# Patient Record
Sex: Female | Born: 1965 | Race: Black or African American | Hispanic: No | Smoking: Never smoker
Health system: Southern US, Community
[De-identification: ages and names within clinical notes are randomized; demographics above are authoritative.]

---

## 2013-09-11 ENCOUNTER — Other Ambulatory Visit (HOSPITAL_COMMUNITY): Payer: Self-pay | Admitting: Plastic Surgery

## 2013-09-11 DIAGNOSIS — IMO0002 Reserved for concepts with insufficient information to code with codable children: Secondary | ICD-10-CM

## 2013-09-12 ENCOUNTER — Other Ambulatory Visit: Payer: Self-pay | Admitting: Radiology

## 2013-09-12 ENCOUNTER — Other Ambulatory Visit (HOSPITAL_COMMUNITY): Payer: Self-pay | Admitting: Plastic Surgery

## 2013-09-12 ENCOUNTER — Ambulatory Visit (HOSPITAL_COMMUNITY)
Admission: RE | Admit: 2013-09-12 | Discharge: 2013-09-12 | Disposition: A | Discharge status: Home / Self Care | Payer: Self-pay | Source: Ambulatory Visit | Attending: Plastic Surgery | Admitting: Plastic Surgery

## 2013-09-12 DIAGNOSIS — R188 Other ascites: Secondary | ICD-10-CM | POA: Insufficient documentation

## 2013-09-12 DIAGNOSIS — IMO0002 Reserved for concepts with insufficient information to code with codable children: Secondary | ICD-10-CM

## 2013-09-12 MED ORDER — CEFAZOLIN SODIUM-DEXTROSE 2-3 GM-% IV SOLR
2.0000 g | Freq: Once | INTRAVENOUS | Status: DC
  Filled 2013-09-12: qty 50

## 2013-09-13 ENCOUNTER — Encounter (HOSPITAL_COMMUNITY): Payer: Self-pay

## 2013-09-13 ENCOUNTER — Ambulatory Visit (HOSPITAL_COMMUNITY): Admission: RE | Admit: 2013-09-13 | Payer: Self-pay | Source: Ambulatory Visit

## 2013-09-13 ENCOUNTER — Ambulatory Visit (HOSPITAL_COMMUNITY)
Admission: RE | Admit: 2013-09-13 | Discharge: 2013-09-13 | Disposition: A | Discharge status: Home / Self Care | Payer: Self-pay | Source: Ambulatory Visit | Attending: Plastic Surgery | Admitting: Plastic Surgery

## 2013-09-13 DIAGNOSIS — IMO0002 Reserved for concepts with insufficient information to code with codable children: Secondary | ICD-10-CM | POA: Insufficient documentation

## 2013-09-13 DIAGNOSIS — G8918 Other acute postprocedural pain: Secondary | ICD-10-CM | POA: Insufficient documentation

## 2013-09-13 DIAGNOSIS — Y849 Medical procedure, unspecified as the cause of abnormal reaction of the patient, or of later complication, without mention of misadventure at the time of the procedure: Secondary | ICD-10-CM | POA: Insufficient documentation

## 2013-09-13 LAB — PROTIME-INR
INR: 1.09 (ref 0.00–1.49)
Prothrombin Time: 13.9 seconds (ref 11.6–15.2)

## 2013-09-13 LAB — CBC
HEMATOCRIT: 25.8 % — AB (ref 36.0–46.0)
Hemoglobin: 8.5 g/dL — ABNORMAL LOW (ref 12.0–15.0)
MCH: 28.7 pg (ref 26.0–34.0)
MCHC: 32.9 g/dL (ref 30.0–36.0)
MCV: 87.2 fL (ref 78.0–100.0)
Platelets: 340 10*3/uL (ref 150–400)
RBC: 2.96 MIL/uL — AB (ref 3.87–5.11)
RDW: 14.1 % (ref 11.5–15.5)
WBC: 5.6 10*3/uL (ref 4.0–10.5)

## 2013-09-13 LAB — APTT: APTT: 32 s (ref 24–37)

## 2013-09-13 MED ORDER — FENTANYL CITRATE 0.05 MG/ML IJ SOLN
INTRAMUSCULAR | Status: AC
  Filled 2013-09-13: qty 2

## 2013-09-13 MED ORDER — SODIUM CHLORIDE 0.9 % IV SOLN: Freq: Once | INTRAVENOUS | Status: DC

## 2013-09-13 MED ORDER — MIDAZOLAM HCL 2 MG/2ML IJ SOLN
INTRAMUSCULAR | Status: AC
  Filled 2013-09-13: qty 4

## 2013-09-13 MED ORDER — MIDAZOLAM HCL 2 MG/2ML IJ SOLN
INTRAMUSCULAR | Status: AC | PRN
  Administered 2013-09-13: 1 mg via INTRAVENOUS
  Administered 2013-09-13: 2 mg via INTRAVENOUS
  Administered 2013-09-13: 1 mg via INTRAVENOUS

## 2013-09-13 MED ORDER — FENTANYL CITRATE 0.05 MG/ML IJ SOLN
INTRAMUSCULAR | Status: AC | PRN
  Administered 2013-09-13 (×2): 50 ug via INTRAVENOUS

## 2013-09-13 NOTE — H&P (Signed)
Nicole Carey is an 48 y.o. female.   Chief Complaint: Pt has undergone abdominal liposuction 08/07/2013 Has had aspiration of abd fluid aspirated every 3 days by surgeon in office since surgery Has gradually been less and less accumulation Fluid serous/blood tinged color per pt. Most recent aspiration was 6/2. MD only able to draw off 10 cc but pt felt "bloated" Abd [place] 6/3 reveals seroma 18 cm x 3 cm in size in low abd Now scheduled for aspiration/possible drain placement with US guidance  HPI: none contributory  History reviewed. No pertinent past medical history.  History reviewed. No pertinent past surgical history.  History reviewed. No pertinent family history. Social History:  reports that she has never smoked. She does not have any smokeless tobacco history on file. Her alcohol and drug histories are not on file.  Allergies: No Known Allergies   (Not in a hospital admission)  Results for orders placed during the hospital encounter of 09/13/13 (from the past 48 hour(s))  APTT     Status: None   Collection Time    09/13/13 12:22 PM      Result Value Ref Range   aPTT 32  24 - 37 seconds  CBC     Status: Abnormal   Collection Time    09/13/13 12:22 PM      Result Value Ref Range   WBC 5.6  4.0 - 10.5 K/uL   RBC 2.96 (*) 3.87 - 5.11 MIL/uL   Hemoglobin 8.5 (*) 12.0 - 15.0 g/dL   HCT 76.5 (*) 46.5 - 03.5 %   MCV 87.2  78.0 - 100.0 fL   MCH 28.7  26.0 - 34.0 pg   MCHC 32.9  30.0 - 36.0 g/dL   RDW 46.5  68.1 - 27.5 %   Platelets 340  150 - 400 K/uL  PROTIME-INR     Status: None   Collection Time    09/13/13 12:22 PM      Result Value Ref Range   Prothrombin Time 13.9  11.6 - 15.2 seconds   INR 1.09  0.00 - 1.49   US Abdomen Limited  09/12/2013   CLINICAL DATA:  Abdominal fluid collection. Status post liposuction. The fluid collection was aspirated in the office recently.  EXAM: LIMITED ABDOMINAL ULTRASOUND  COMPARISON:  None.  FINDINGS: There is a 17.8 x 2.9 cm  subcutaneous fluid collection in the surgical bed. It is complex with internal echogenic signal.  IMPRESSION: Large subcutaneous fluid collection. Patient will return tomorrow for drain.   Electronically Signed   By: Maryclare Bean M.D.   On: 09/12/2013 12:54    Review of Systems  Constitutional: Negative for fever and weight loss.  Respiratory: Negative for cough and shortness of breath.   Cardiovascular: Negative for chest pain.  Gastrointestinal: Negative for nausea, vomiting and abdominal pain.  Neurological: Negative for weakness and headaches.  Psychiatric/Behavioral: Negative for substance abuse.    Blood pressure 121/64, pulse 83, temperature 99.1 F (37.3 C), temperature source Oral, resp. rate 18, height 5\' 6"  (1.676 m), weight 81.647 kg (180 lb), last menstrual period 09/05/2013, SpO2 100.00%. Physical Exam  Constitutional: She is oriented to person, place, and time. She appears well-nourished.  Cardiovascular: Normal rate, regular rhythm and normal heart sounds.   No murmur heard. Respiratory: Effort normal and breath sounds normal. She has no wheezes.  GI: Soft. Bowel sounds are normal. There is no tenderness.  abd soft; NT No masses  Musculoskeletal: Normal range of motion.  Neurological:  She is alert and oriented to person, place, and time.  Skin: Skin is warm and dry.  Psychiatric: She has a normal mood and affect. Her behavior is normal. Judgment and thought content normal.     Assessment/Plan Previous abd liposuction 08/07/13 Accumulation of fluid in abd aspirated by MD in office every 3 days since surgery. Pt continues to have bloated feeling US abd 6/3 reveals 18cm x 3 cm seroma in low abd Scheduled now for asp vs drain placement. Pt aware of procedure benefits and risks and agreeable to proceed Consent signed and in chart  Robet Leuamela A Jamarl Pew 09/13/2013, 1:43 PM

## 2013-09-13 NOTE — Sedation Documentation (Signed)
Discharge instructions given pt verbalizes understanding

## 2013-09-13 NOTE — Procedures (Signed)
Interventional Radiology Procedure Note  Procedure: Placement of a 69F drain modified with additional sideholes into the superficial abdominal wall complex fluid.  150 mL dark, old blood aspirated. Drain left to JP drain.  Complications: None Recommendations: - Maintain drain to JP bulb.  May cap for short periods of activity as needed. - Pt to follow up with plastics next week.  If drain output < 10-15 mL for at least 2 days, may remove drain.   Signed,  Sterling Big, MD [facility]

## 2014-09-06 ENCOUNTER — Other Ambulatory Visit: Payer: Self-pay | Admitting: Obstetrics & Gynecology

## 2014-09-06 DIAGNOSIS — N63 Unspecified lump in unspecified breast: Secondary | ICD-10-CM

## 2015-02-14 IMAGING — US US ABDOMEN LIMITED
1 series · 14 of 16 positions shown · non-contrast
Comparison: None.

CLINICAL DATA: Abdominal fluid collection. Status post liposuction.
The fluid collection was aspirated in the office recently.

EXAM:
LIMITED ABDOMINAL ULTRASOUND

[Series 1: us abdomen limited · 0.10mm/px · 14 of 16 slices shown]
[im 1/16]
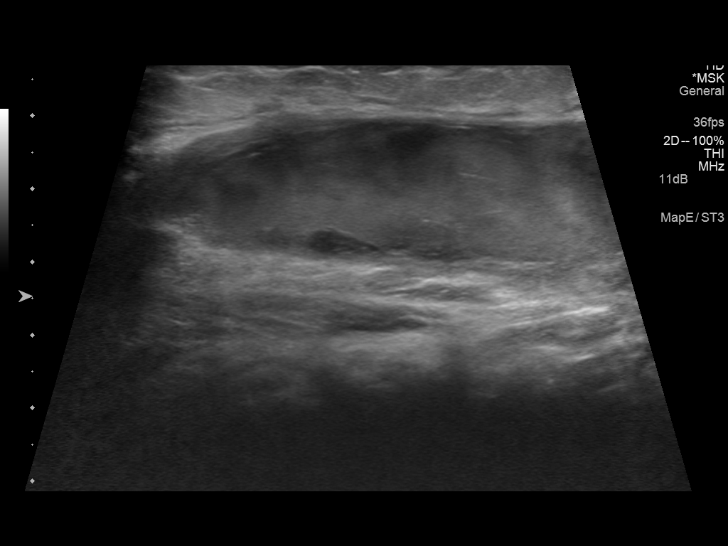
[im 2/16]
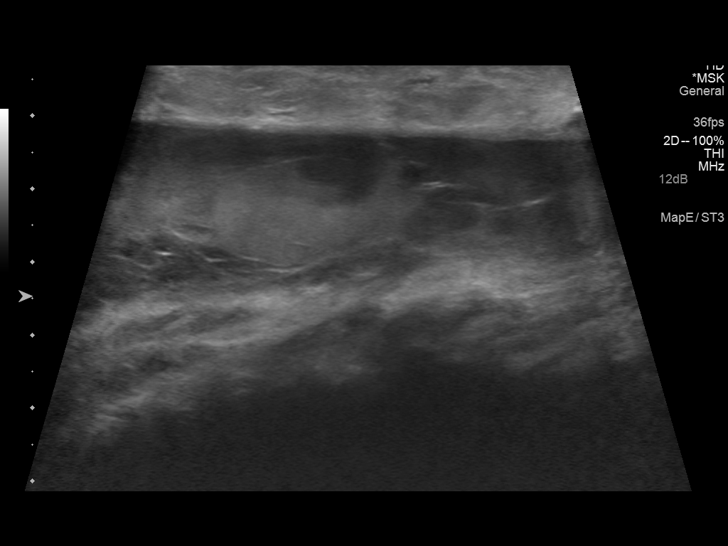
[im 3/16]
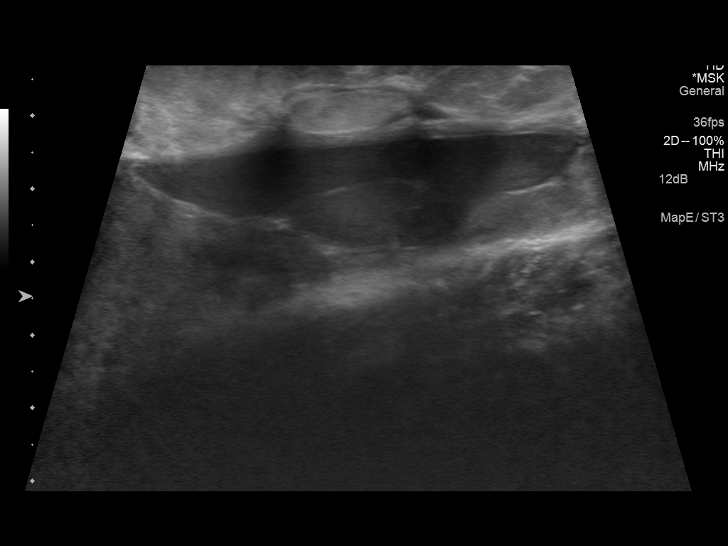
[im 5/16]
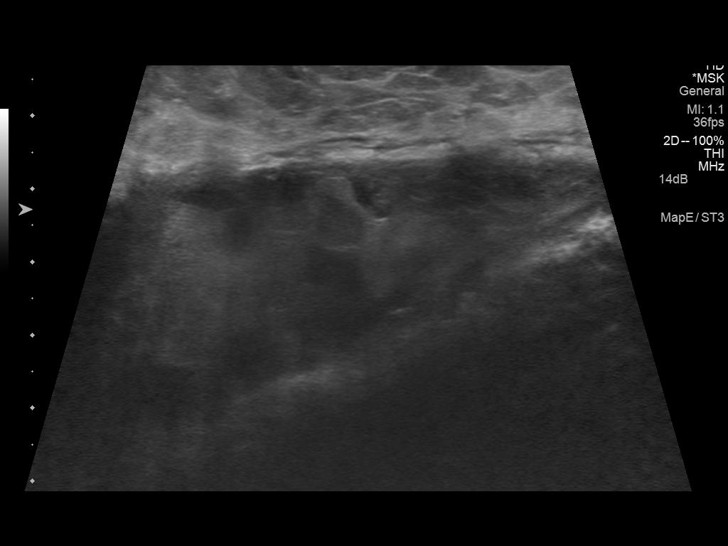
[im 6/16]
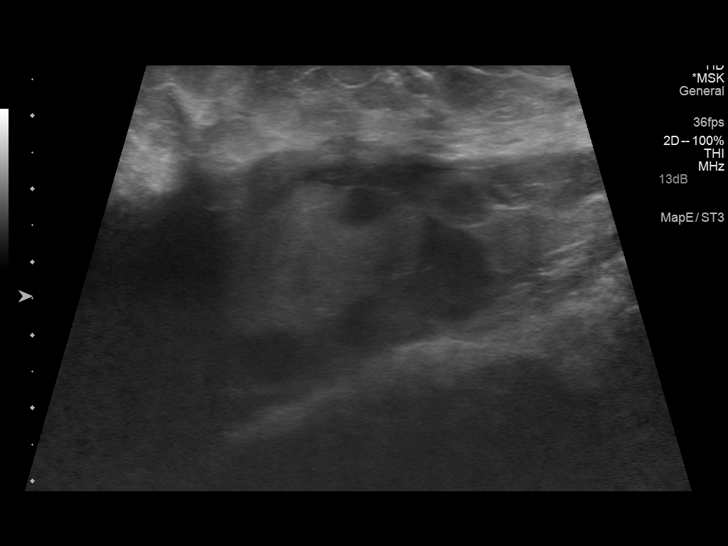
[im 7/16]
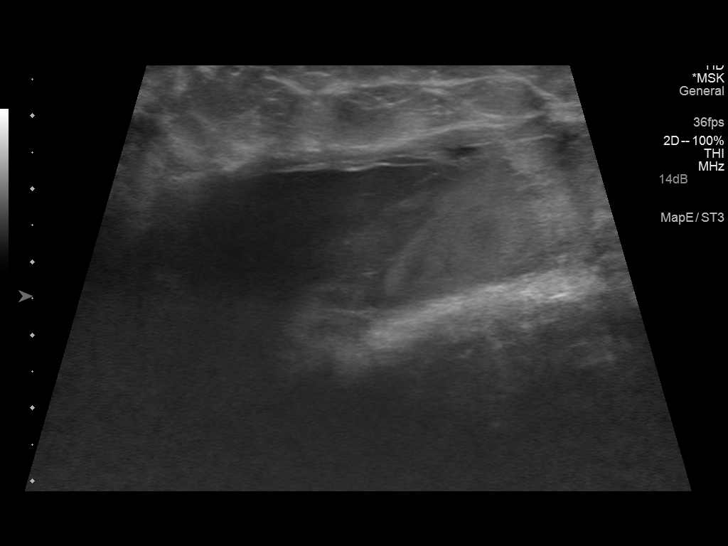
[im 8/16]
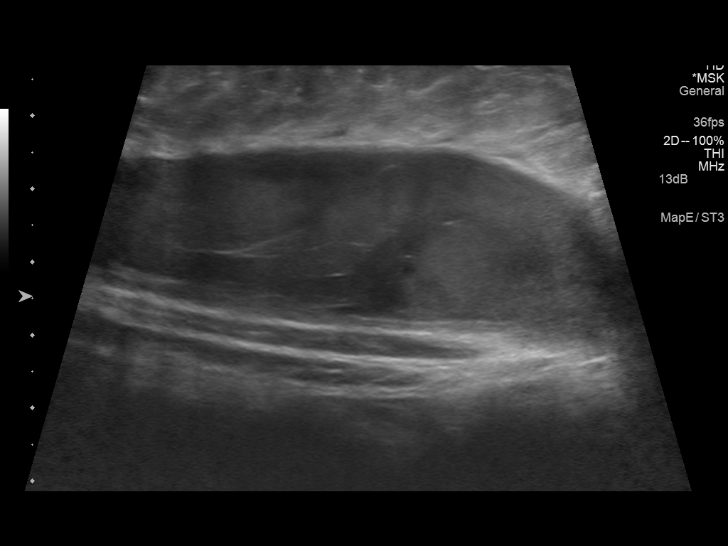
[im 9/16]
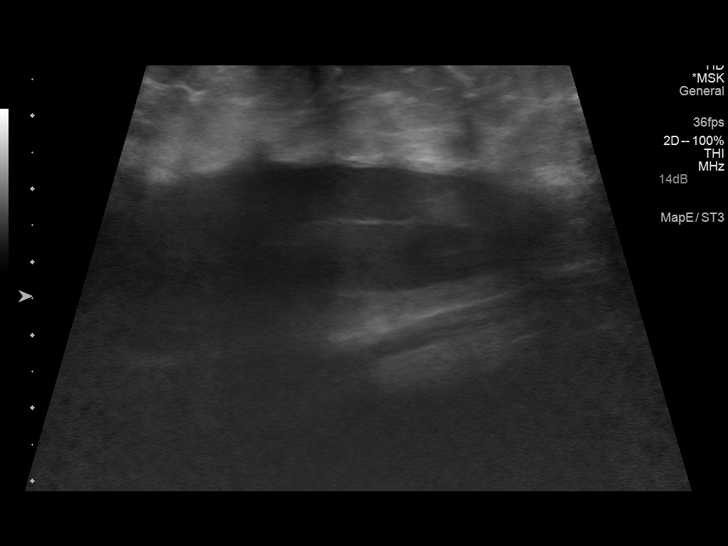
[im 10/16]
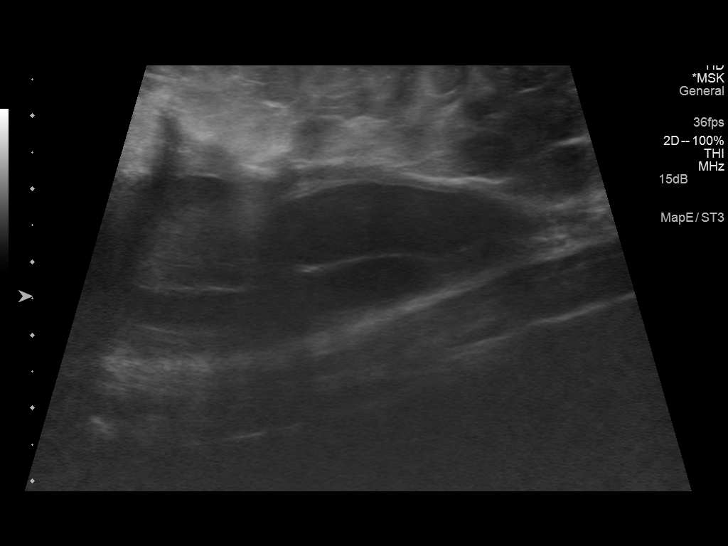
[im 11/16]
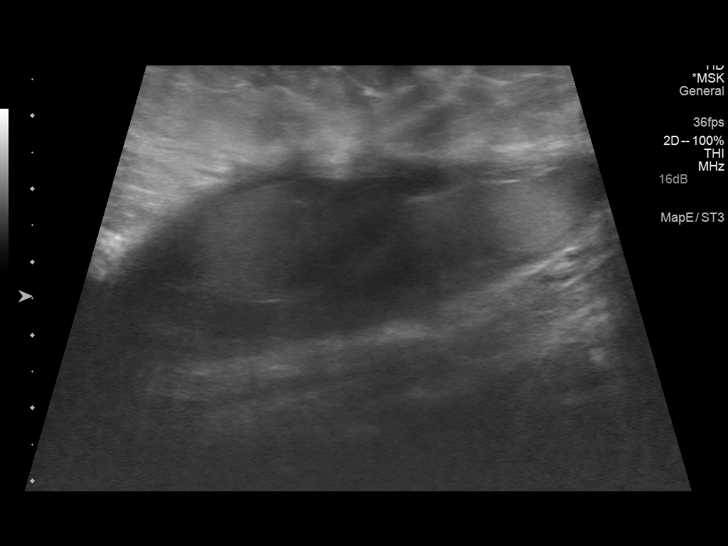
[im 13/16]
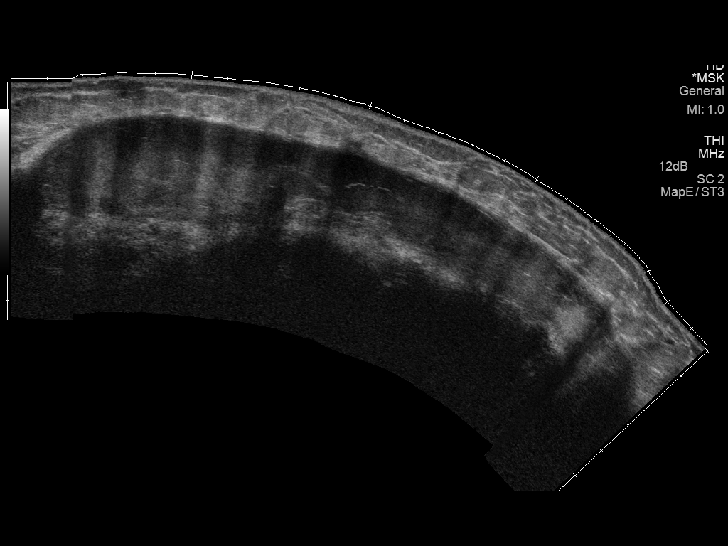
[im 14/16]
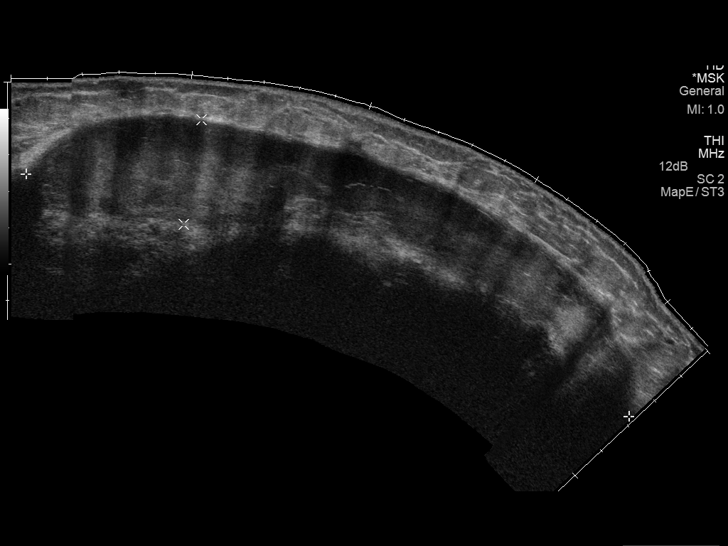
[im 15/16]
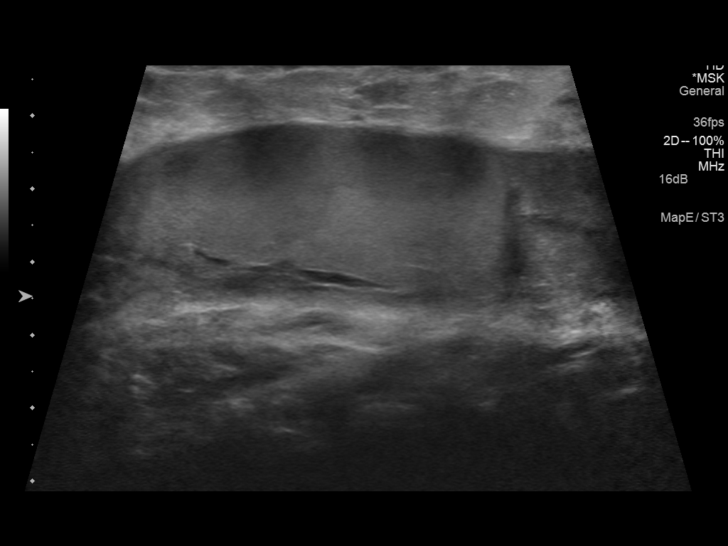
[im 16/16]
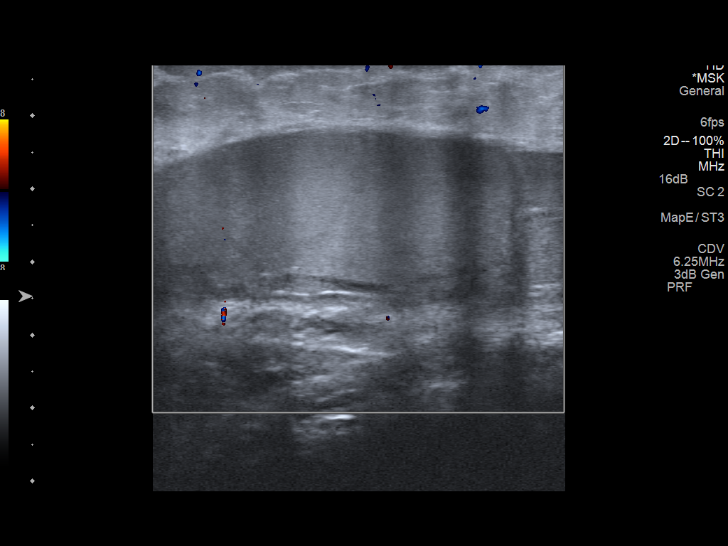

[14 of 16 positions shown; findings below may reference images not displayed]

FINDINGS: There is a 17.8 x 2.9 cm subcutaneous fluid collection in the
surgical bed. It is complex with internal echogenic signal.
IMPRESSION: Large subcutaneous fluid collection. Patient will return tomorrow
for drain.

## 2015-02-15 IMAGING — US US ABSCESS DRAINAGE W/ CATHETER
1 series · 10 of 10 positions shown · non-contrast
Comparison: none

CLINICAL DATA: 47-year-old female status post liposuction procedure
complicated by a persistent postoperative complex fluid collection
concerning for seroma or liquified hematoma. She presents for
percutaneous drain placement an aspiration.
TECHNIQUE: Informed consent was obtained from the patient following explanation
of the procedure, risks, benefits and alternatives. The patient
understands, agrees and consents for the procedure. All questions
were addressed. A time out was performed.

[Series 1: us abscess drainage w/ catheter · 0.12mm/px · 10 of 10 slices shown]
[im 1/10]
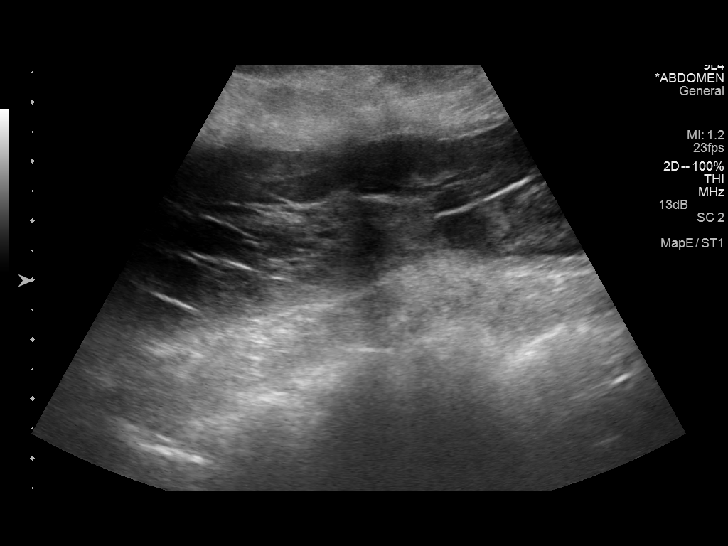
[im 2/10]
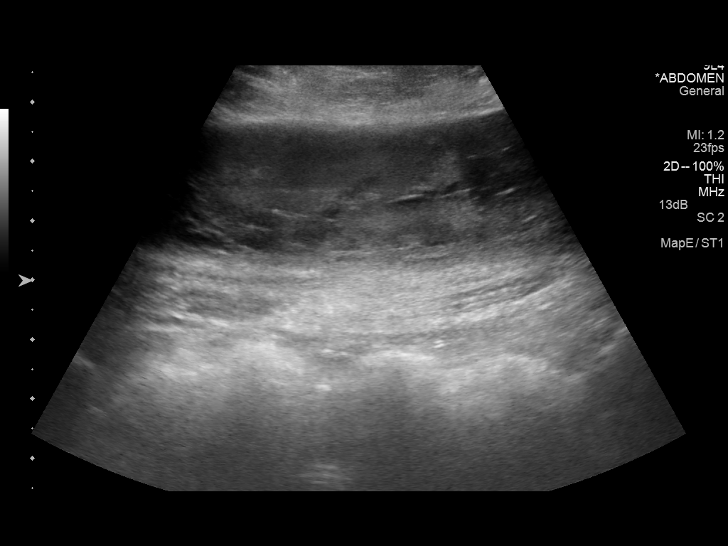
[im 3/10]
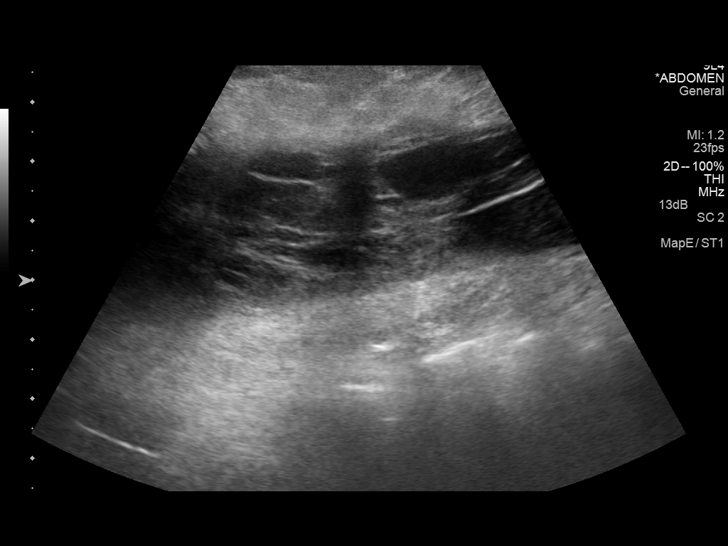
[im 4/10]
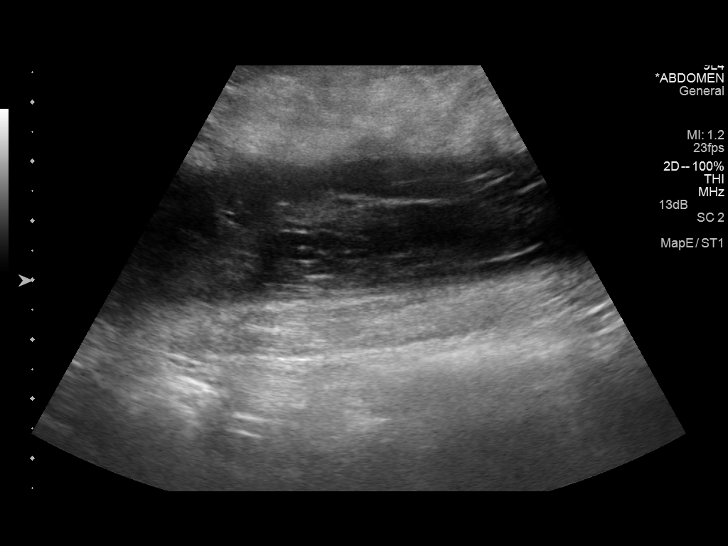
[im 5/10]
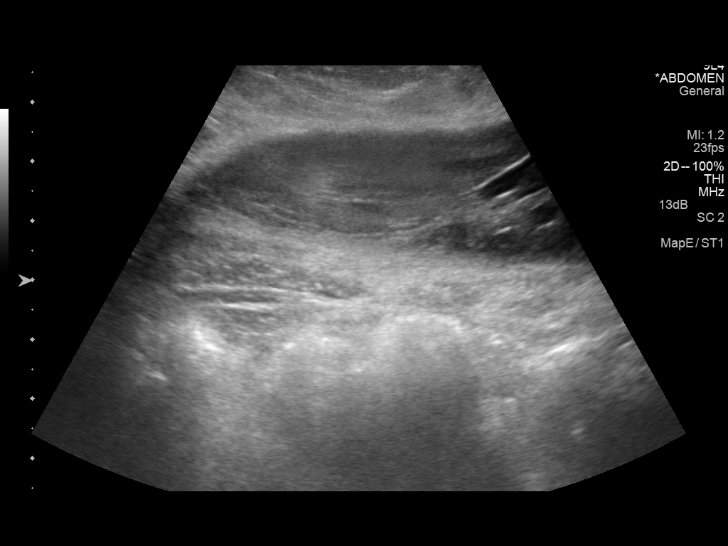
[im 6/10]
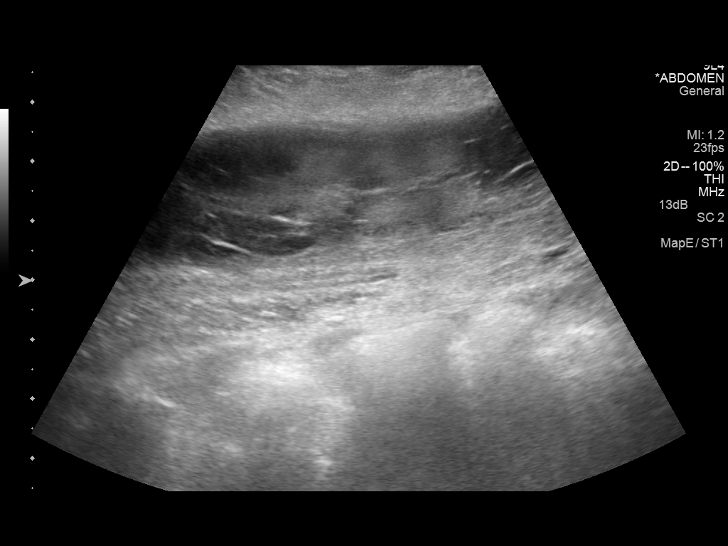
[im 7/10]
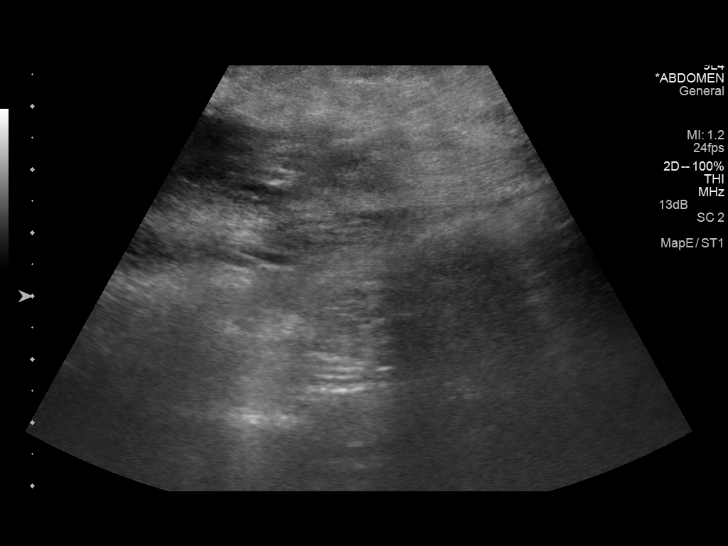
[im 8/10]
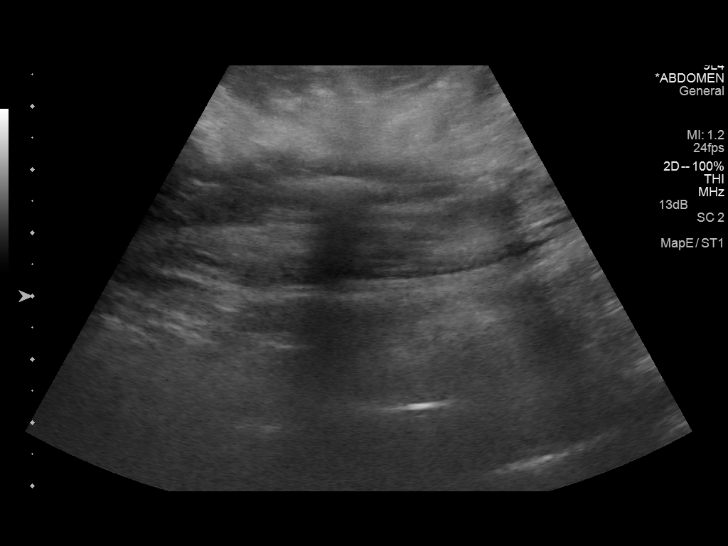
[im 9/10]
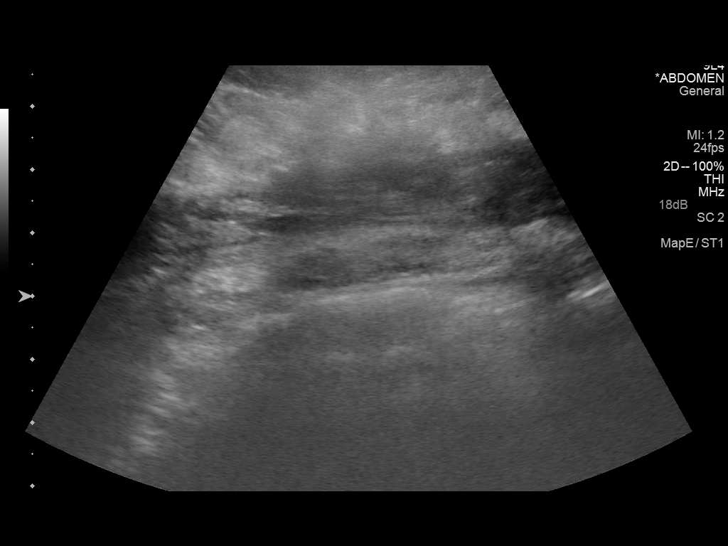
[im 10/10]
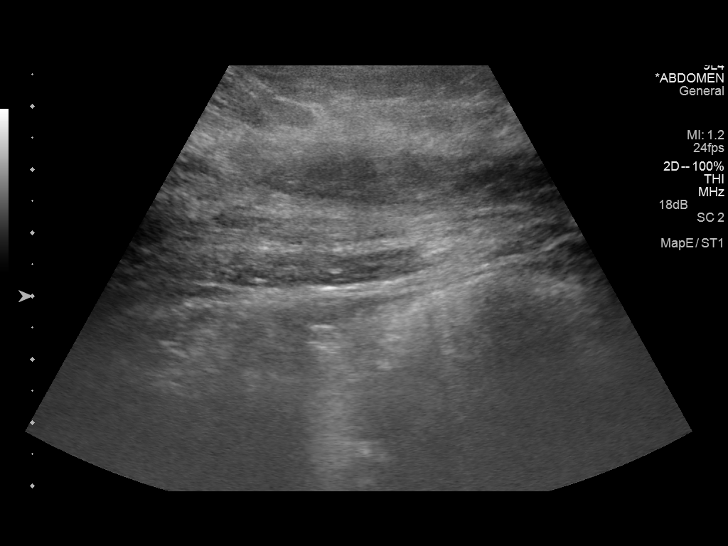

[10 of 10 positions shown; findings below may reference images not displayed]

EXAM:
ULTRASOUND GUIDED ABSCESS DRAINAGE

Date: 09/13/2013

PROCEDURE:
1. Ultrasound-guided placement of a percutaneous drain into the
superficial subcutaneous fluid collection in the anterior abdominal
wall.

ANESTHESIA/SEDATION:
Moderate (conscious) sedation was used. Four mg Versed, 100 mcg
Fentanyl were administered intravenously. The patient's vital signs
were monitored continuously by radiology nursing throughout the
procedure.

Sedation Time: 20 minutes
The anterior abdominal wall was interrogated with ultrasound. There
is a large complex fluid collection extending across the entirety of
the lower abdominal wall. The fluid collection centered within the
superficial subcutaneous tissues. Sonographically, the collection
has an appearance most consistent with hematoma. A suitable skin
entry site in the right inguinal region at one of the well healed
surgical incision sites was selected and marked. The region was
sterilely prepped and draped in the usual fashion using Betadine
skin prep.

Local anesthesia was attained by infiltration with 1% lidocaine. A
small dermatotomy was made. Under real-time sonographic guidance, an
18 gauge trocar needle was advanced into the complex fluid
collection. There was immediate return of thin, dark red fluid
consistent with liquefied hematoma. A short Amplatz wire was
advanced into the fluid collection. The skin tract was then serially
dilated to 10 French and ultimately Matshidi Ronel 10 French drain (modified
with additional side holes) was advanced over the wire and into the
fluid collection. The locking loop was formed. Approximately 150 mL
of thin, dark old blood was successfully aspirated. The abdominal
wall was massaged and the fluid collection lavaged several times
with sterile saline until no more fluid could be aspirated. Repeat
sonographic evaluation demonstrates near-total aspiration of the
hematoma. The drain was secured to the skin with an 0 Prolene suture
and connected to JP bulb suction.

The patient tolerated the procedure well; there was no immediate
complication.
IMPRESSION: IMPRESSION
Successful placement of a 10 French percutaneous drain modified with
additional sideholes into the complex superficial anterior abdominal
wall fluid collection with aspiration of 150 mL of thin, dark old
blood. The fluid collection is most consistent with liquefying
hematoma.

The drain was left to JP bulb suction. Once the drain output is
minimal (10-15 mL daily for at least 2 days) the drain can be cut
and removed.
